# Patient Record
Sex: Male | Born: 1980 | Race: Black or African American | Hispanic: No | Marital: Single | Smoking: Current every day smoker
Health system: Southern US, Community
[De-identification: ages and names within clinical notes are randomized; demographics above are authoritative.]

---

## 2002-02-01 ENCOUNTER — Emergency Department (HOSPITAL_COMMUNITY): Admission: EM | Admit: 2002-02-01 | Discharge: 2002-02-01 | Payer: Self-pay | Admitting: Emergency Medicine

## 2018-03-21 ENCOUNTER — Emergency Department (HOSPITAL_COMMUNITY)
Admission: EM | Admit: 2018-03-21 | Discharge: 2018-03-21 | Disposition: A | Discharge status: Home / Self Care | Payer: Self-pay | Attending: Emergency Medicine | Admitting: Emergency Medicine

## 2018-03-21 ENCOUNTER — Other Ambulatory Visit: Payer: Self-pay

## 2018-03-21 ENCOUNTER — Encounter (HOSPITAL_COMMUNITY): Payer: Self-pay

## 2018-03-21 DIAGNOSIS — S51812A Laceration without foreign body of left forearm, initial encounter: Secondary | ICD-10-CM | POA: Insufficient documentation

## 2018-03-21 DIAGNOSIS — S61512A Laceration without foreign body of left wrist, initial encounter: Secondary | ICD-10-CM | POA: Insufficient documentation

## 2018-03-21 DIAGNOSIS — S01312A Laceration without foreign body of left ear, initial encounter: Secondary | ICD-10-CM | POA: Insufficient documentation

## 2018-03-21 DIAGNOSIS — Y9289 Other specified places as the place of occurrence of the external cause: Secondary | ICD-10-CM | POA: Insufficient documentation

## 2018-03-21 DIAGNOSIS — Y939 Activity, unspecified: Secondary | ICD-10-CM | POA: Insufficient documentation

## 2018-03-21 DIAGNOSIS — S41112A Laceration without foreign body of left upper arm, initial encounter: Secondary | ICD-10-CM | POA: Insufficient documentation

## 2018-03-21 DIAGNOSIS — Y998 Other external cause status: Secondary | ICD-10-CM | POA: Insufficient documentation

## 2018-03-21 DIAGNOSIS — S71112A Laceration without foreign body, left thigh, initial encounter: Secondary | ICD-10-CM | POA: Insufficient documentation

## 2018-03-21 DIAGNOSIS — T07XXXA Unspecified multiple injuries, initial encounter: Secondary | ICD-10-CM

## 2018-03-21 DIAGNOSIS — Z23 Encounter for immunization: Secondary | ICD-10-CM | POA: Insufficient documentation

## 2018-03-21 MED ORDER — LIDOCAINE HCL 2 % IJ SOLN
20.0000 mL | Freq: Once | INTRAMUSCULAR | Status: AC
  Administered 2018-03-21: 400 mg via INTRADERMAL
  Filled 2018-03-21: qty 20

## 2018-03-21 MED ORDER — CEPHALEXIN 500 MG PO CAPS: 500.0000 mg | ORAL_CAPSULE | Freq: Four times a day (QID) | ORAL | 0 refills | Status: AC

## 2018-03-21 MED ORDER — TETANUS-DIPHTH-ACELL PERTUSSIS 5-2.5-18.5 LF-MCG/0.5 IM SUSP
0.5000 mL | Freq: Once | INTRAMUSCULAR | Status: AC
  Administered 2018-03-21: 0.5 mL via INTRAMUSCULAR
  Filled 2018-03-21: qty 0.5
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
## 2018-03-21 NOTE — Discharge Instructions (Addendum)
You were given a prescription for antibiotics. Please take the antibiotic prescription fully.   Please follow-up for suture removal at either urgent care ,the emergency department, or your primary care doctor in 7-10 days.  Please return to the emergency room immediately if you experience any new or worsening symptoms or any symptoms that indicate worsening infection such as fevers, increased redness/swelling/pain, warmth, or drainage from the affected area.   

## 2018-03-21 NOTE — ED Notes (Signed)
EDP at bedside  

## 2018-03-21 NOTE — ED Notes (Signed)
Pt also has a laceration to the left ear and superficial lacerations to the posterior left thigh

## 2018-03-21 NOTE — ED Notes (Signed)
Patient verbalized understanding of discharge instructions and denies any further needs or questions at this time. VS stable. Patient ambulatory with steady gait.  

## 2018-04-04 ENCOUNTER — Ambulatory Visit (HOSPITAL_COMMUNITY)
Admission: EM | Admit: 2018-04-04 | Discharge: 2018-04-04 | Disposition: A | Discharge status: Home / Self Care | Payer: Self-pay

## 2018-04-04 ENCOUNTER — Other Ambulatory Visit: Payer: Self-pay

## 2018-04-04 NOTE — ED Notes (Signed)
Pt here for suture removal. 21 removed by brenda. Pt no further questions

## 2018-11-24 ENCOUNTER — Other Ambulatory Visit: Payer: Self-pay

## 2018-11-24 ENCOUNTER — Encounter (HOSPITAL_COMMUNITY): Payer: Self-pay | Admitting: Emergency Medicine

## 2018-11-24 ENCOUNTER — Emergency Department (HOSPITAL_COMMUNITY)
Admission: EM | Admit: 2018-11-24 | Discharge: 2018-11-24 | Disposition: A | Discharge status: Home / Self Care | Payer: Self-pay | Attending: Emergency Medicine | Admitting: Emergency Medicine

## 2018-11-24 DIAGNOSIS — Y93G1 Activity, food preparation and clean up: Secondary | ICD-10-CM | POA: Insufficient documentation

## 2018-11-24 DIAGNOSIS — W260XXA Contact with knife, initial encounter: Secondary | ICD-10-CM | POA: Insufficient documentation

## 2018-11-24 DIAGNOSIS — Y929 Unspecified place or not applicable: Secondary | ICD-10-CM | POA: Insufficient documentation

## 2018-11-24 DIAGNOSIS — F17200 Nicotine dependence, unspecified, uncomplicated: Secondary | ICD-10-CM | POA: Insufficient documentation

## 2018-11-24 DIAGNOSIS — Y999 Unspecified external cause status: Secondary | ICD-10-CM | POA: Insufficient documentation

## 2018-11-24 DIAGNOSIS — S51811A Laceration without foreign body of right forearm, initial encounter: Secondary | ICD-10-CM | POA: Insufficient documentation

## 2018-11-24 DIAGNOSIS — S41111A Laceration without foreign body of right upper arm, initial encounter: Secondary | ICD-10-CM

## 2018-11-24 MED ORDER — LIDOCAINE HCL (PF) 1 % IJ SOLN
5.0000 mL | Freq: Once | INTRAMUSCULAR | Status: AC
  Administered 2018-11-24: 5 mL
  Filled 2018-11-24: qty 5
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
## 2018-11-24 NOTE — Discharge Instructions (Addendum)
1. Medications: Tylenol or ibuprofen for pain, usual home medications ° °2. Treatment: ice for swelling, keep wound clean with warm soap and water and keep bandage dry, do not submerge in water for 24 hours ° °3. Follow Up: Please return in 10 days to have your stitches/staples removed or sooner if you have concerns. Return to the emergency department for increased redness, drainage of pus from the wound ° ° °WOUND CARE ° Keep area clean and dry for 24 hours. Do not remove bandage, if applied. ° After 24 hours, remove bandage and wash wound gently with mild soap and warm water. Reapply a new bandage after cleaning wound, if directed.  ° Continue daily cleansing with soap and water until stitches/staples are removed. ° Do not apply any ointments or creams to the wound while stitches/staples are in place, as this may cause delayed healing. °Return if you experience any of the following signs of infection: Swelling, redness, pus drainage, streaking, fever >101.0 F ° Return if you experience excessive bleeding that does not stop after 15-20 minutes of constant, firm pressure. ° °

## 2018-11-24 NOTE — ED Triage Notes (Signed)
Pt states he puts his dishes in the floor when cleaning the counter and that he slipped and cut R forearm on a knife that was sticking up 1 hour ago.  Approx 2 cm laceration/puncture wound to R forearm.  Denies LOC.  Denies any other injuries from fall.  Bleeding controlled PTA.

## 2018-11-24 NOTE — ED Notes (Signed)
Wound irrigated, suture cart with lac tray at bedside.

## 2018-12-05 ENCOUNTER — Other Ambulatory Visit: Payer: Self-pay

## 2018-12-05 ENCOUNTER — Emergency Department (HOSPITAL_COMMUNITY)
Admission: EM | Admit: 2018-12-05 | Discharge: 2018-12-05 | Disposition: A | Discharge status: Home / Self Care | Payer: Self-pay | Attending: Emergency Medicine | Admitting: Emergency Medicine

## 2018-12-05 DIAGNOSIS — S51811D Laceration without foreign body of right forearm, subsequent encounter: Secondary | ICD-10-CM | POA: Insufficient documentation

## 2018-12-05 DIAGNOSIS — F17200 Nicotine dependence, unspecified, uncomplicated: Secondary | ICD-10-CM | POA: Insufficient documentation

## 2018-12-05 DIAGNOSIS — X58XXXD Exposure to other specified factors, subsequent encounter: Secondary | ICD-10-CM | POA: Insufficient documentation

## 2018-12-05 DIAGNOSIS — Z5189 Encounter for other specified aftercare: Secondary | ICD-10-CM

## 2018-12-05 DIAGNOSIS — Z4802 Encounter for removal of sutures: Secondary | ICD-10-CM | POA: Insufficient documentation

## 2018-12-05 NOTE — ED Triage Notes (Addendum)
Pt reports numbness and tingling distal to puncture wound for last 10 days. Needs sutures removed and requested an X-ray.

## 2018-12-05 NOTE — ED Provider Notes (Signed)
[facility] EMERGENCY DEPARTMENT Provider Note   CSN: [id number] Arrival date & time: 12/05/18  1132     History   Chief Complaint Chief Complaint  Patient presents with  . Wound Check    HPI Joseph Meadows is a 38 y.o. male presenting to the emergency department for wound recheck and suture removal.  Patient sustained a laceration to the right forearm on 11/24/2018 was sutured with 3 Prolene sutures.  Patient was noted to be neurovascularly intact with normal range of motion on exam.  Reports normal wound healing.  He states he has been experiencing some tingling sensation distal to the wound with certain positions.  He states he also has some decreased sensation distal to the wound.  He has no weakness.     The history is provided by the patient and medical records.    No past medical history on file.  There are no active problems to display for this patient.   No past surgical history on file.      Home Medications    Prior to Admission medications   Not on File    Family History No family history on file.  Social History Social History   Tobacco Use  . Smoking status: Current Every Day Smoker  . Smokeless tobacco: Never Used  Substance Use Topics  . Alcohol use: Yes  . Drug use: Yes    Types: Marijuana     Allergies   Patient has no known allergies.   Review of Systems Review of Systems  All other systems reviewed and are negative.    Physical Exam Updated Vital Signs BP (!) 124/48 (BP Location: Left Arm)   Pulse 84   Resp 16   SpO2 100%   Physical Exam Vitals signs and nursing note reviewed.  Constitutional:      General: He is not in acute distress.    Appearance: He is well-developed.  HENT:     Head: Normocephalic and atraumatic.  Eyes:     Conjunctiva/sclera: Conjunctivae normal.  Cardiovascular:     Rate and Rhythm: Normal rate.  Pulmonary:     Effort: Pulmonary effort is normal.  Musculoskeletal:   Comments: Small sutured wound to mid right lateral forearm with 3 sutures in place.  Wound appears to be healing well.  Wound is clean.  No swelling or drainage.  Patient reports some mild decrease sensation just distal to the wound on the right arm extending in a straight line from the wound down through the thumb.  Normal sensation otherwise.  Strong and equal grip strength bilaterally.  Normal radial pulse.  Normal range of motion of the wrist and forearm.  Neurological:     Mental Status: He is alert.  Psychiatric:        Mood and Affect: Mood normal.        Behavior: Behavior normal.      ED Treatments / Results  Labs (all labs ordered are listed, but only abnormal results are displayed) Labs Reviewed - No data to display  EKG None  Radiology No results found.  Procedures .Suture Removal  Date/Time: 12/05/2018 11:49 AM Performed by: Robinson, [place] N, PA-C Authorized by: Robinson, [place] N, PA-C   Consent:    Consent obtained:  Verbal   Consent given by:  Patient   Risks discussed:  Pain and wound separation Location:    Location:  Upper extremity   Upper extremity location:  Arm   Arm location:  R lower  arm Procedure details:    Wound appearance:  No signs of infection, good wound healing and clean   Number of sutures removed:  3 Post-procedure details:    Patient tolerance of procedure:  Tolerated well, no immediate complications   (including critical care time)  Medications Ordered in ED Medications - No data to display   Initial Impression / Assessment and Plan / ED Course  I have reviewed the triage vital signs and the nursing notes.  Pertinent labs & imaging results that were available during my care of the patient were reviewed by me and considered in my medical decision making (see chart for details).      Pt to ER for staple/suture removal and wound check as above. Procedure tolerated well. Vitals normal, no signs of infection.  Patient reports  some decreased sensation distal to the wound, tracking in a straight line from the wound down to the thumb.  He has no weakness on exam and normal pulses.  Discussed possibility of side effects after lidocaine versus localized inflammation as cause of symptoms however will provide hand specialist referral for follow-up if symptoms persist.  Scar minimization & return precautions given at dc.  Patient agreeable to plan and safe for discharge.   Final Clinical Impressions(s) / ED Diagnoses   Final diagnoses:  Visit for wound check  Visit for suture removal    ED Discharge Orders    None       Robinson, [place] N, PA-C 12/05/18 1159    Virgina Norfolk, DO 12/05/18 1200

## 2019-03-26 ENCOUNTER — Emergency Department (HOSPITAL_COMMUNITY)
Admission: EM | Admit: 2019-03-26 | Discharge: 2019-03-26 | Disposition: A | Discharge status: Home / Self Care | Payer: Self-pay | Attending: Emergency Medicine | Admitting: Emergency Medicine

## 2019-03-26 ENCOUNTER — Other Ambulatory Visit: Payer: Self-pay

## 2019-03-26 ENCOUNTER — Encounter (HOSPITAL_COMMUNITY): Payer: Self-pay | Admitting: Emergency Medicine

## 2019-03-26 DIAGNOSIS — F17210 Nicotine dependence, cigarettes, uncomplicated: Secondary | ICD-10-CM | POA: Insufficient documentation

## 2019-03-26 DIAGNOSIS — M545 Low back pain, unspecified: Secondary | ICD-10-CM

## 2019-03-26 DIAGNOSIS — Y9367 Activity, basketball: Secondary | ICD-10-CM | POA: Insufficient documentation

## 2019-03-26 MED ORDER — NAPROXEN 500 MG PO TABS: 500.0000 mg | ORAL_TABLET | Freq: Two times a day (BID) | ORAL | 0 refills | Status: AC

## 2019-03-26 MED ORDER — METHOCARBAMOL 500 MG PO TABS: 500.0000 mg | ORAL_TABLET | Freq: Two times a day (BID) | ORAL | 0 refills | Status: AC

## 2019-03-26 MED ORDER — METHOCARBAMOL 500 MG PO TABS
500.0000 mg | ORAL_TABLET | Freq: Once | ORAL | Status: AC
  Administered 2019-03-26: 500 mg via ORAL
  Filled 2019-03-26: qty 1

## 2019-03-26 MED ORDER — KETOROLAC TROMETHAMINE 60 MG/2ML IM SOLN
60.0000 mg | Freq: Once | INTRAMUSCULAR | Status: AC
  Administered 2019-03-26: 05:00:00 60 mg via INTRAMUSCULAR
  Filled 2019-03-26: qty 2

## 2019-03-26 NOTE — ED Triage Notes (Signed)
Pt was playing basketball and as he was shooting the ball "my back when out and I fell to the ground."  Pain is in the lower left side of his back.   Pt states he is unable to stand up straight and has trouble "moving my left leg."

## 2019-03-26 NOTE — Discharge Instructions (Signed)
Take the prescribed medication as directed.  May wish to use heat/ice on the back to help with pain. Follow-up with your primary care doctor. Return to the ED for new or worsening symptoms.

## 2019-03-26 NOTE — ED Provider Notes (Signed)
[facility] EMERGENCY DEPARTMENT Provider Note   CSN: [id number] Arrival date & time: 03/26/19  8657     History Chief Complaint  Patient presents with  . Back Pain    Joseph Meadows is a 39 y.o. male.  The history is provided by the patient and medical records.  Back Pain   39 y.o. male presenting to the ED with back pain.  Patient reports he was playing basketball and went to do a crossover against opponent, went up for a lay up and felt his back "lock up".  States he fell to the floor.  States left side of his lower back feels tight and stiff, worse with standing and trying to move his left leg.  He denies any numbness/weakness of the legs.  No bowel or bladder incontinence.  He did try taking some Motrin at home without relief. States "I think I need something to help relax".    History reviewed. No pertinent past medical history.  There are no problems to display for this patient.   History reviewed. No pertinent surgical history.     No family history on file.  Social History   Tobacco Use  . Smoking status: Current Every Day Smoker  . Smokeless tobacco: Never Used  Substance Use Topics  . Alcohol use: Yes  . Drug use: Yes    Types: Marijuana    Home Medications Prior to Admission medications   Not on File    Allergies    Patient has no known allergies.  Review of Systems   Review of Systems  Musculoskeletal: Positive for back pain.  All other systems reviewed and are negative.   Physical Exam Updated Vital Signs BP 127/63 (BP Location: Right Arm)   Pulse 70   Temp 98.3 F (36.8 C) (Oral)   Resp 18   Ht 6' (1.829 m)   Wt 95.3 kg   SpO2 100%   BMI 28.48 kg/m   Physical Exam Vitals and nursing note reviewed.  Constitutional:      Appearance: He is well-developed.  HENT:     Head: Normocephalic and atraumatic.  Eyes:     Conjunctiva/sclera: Conjunctivae normal.     Pupils: Pupils are equal, round, and reactive to light.   Cardiovascular:     Rate and Rhythm: Normal rate and regular rhythm.     Heart sounds: Normal heart sounds.  Pulmonary:     Effort: Pulmonary effort is normal. No respiratory distress.     Breath sounds: Normal breath sounds. No rhonchi.  Abdominal:     General: Bowel sounds are normal.     Palpations: Abdomen is soft.     Tenderness: There is no guarding.     Hernia: No hernia is present.  Musculoskeletal:        General: Normal range of motion.     Cervical back: Normal range of motion.     Comments: Endorses pain of left lumbar area without any focal tenderness, step-off, or deformity; some pain with movement of the left leg but not a distinctly positive straight leg raise, he has normal strength and sensation of both legs  Skin:    General: Skin is warm and dry.  Neurological:     Mental Status: He is alert and oriented to person, place, and time.     ED Results / Procedures / Treatments   Labs (all labs ordered are listed, but only abnormal results are displayed) Labs Reviewed - No data to display  EKG None  Radiology No results found.  Procedures Procedures (including critical care time)  Medications Ordered in ED Medications  ketorolac (TORADOL) injection 60 mg (60 mg Intramuscular Given 03/26/19 0507)  methocarbamol (ROBAXIN) tablet 500 mg (500 mg Oral Given 03/26/19 0507)    ED Course  I have reviewed the triage vital signs and the nursing notes.  Pertinent labs & imaging results that were available during my care of the patient were reviewed by me and considered in my medical decision making (see chart for details).    MDM Rules/Calculators/A&P  39 year old male presenting to the ED with left lower back pain.  States while playing basketball his back "locked up".  Has had ongoing pain since this.  Tried Motrin at home without relief.  He is afebrile and nontoxic in appearance.  On exam he is lying flat on his stomach.  He reports pain of his left lumbar  paraspinal area without any noted step-off or deformity.  He is able to lift both of his legs up off the bed independently, does endorse pain with movement of left leg.  This is not a distinctly positive straight leg raise.  No apparent strength or sensory deficit to the legs.  No bowel or bladder incontinence.  Clinically this is not concerning for cauda equina or other emergent pathology.  Feel he likely tweaked his back while playing basketball as he does report going in for a crossover prior to this happening.  He was given Toradol and Robaxin here with some improvement and is moving more easily.  Feel he is stable for discharge home with continued symptomatic care.  Also encouraged heat and ice.  Follow-up with PCP.  Return here for any new or acute changes.  Final Clinical Impression(s) / ED Diagnoses Final diagnoses:  Acute left-sided low back pain without sciatica    Rx / DC Orders ED Discharge Orders         Ordered    methocarbamol (ROBAXIN) 500 MG tablet  2 times daily     03/26/19 0546    naproxen (NAPROSYN) 500 MG tablet  2 times daily with meals     03/26/19 0546           Garlon Hatchet, PA-C 03/26/19 0604    Dione Booze, MD 03/26/19 [phone]

## 2019-10-12 ENCOUNTER — Other Ambulatory Visit: Payer: Self-pay

## 2019-10-12 ENCOUNTER — Emergency Department (HOSPITAL_COMMUNITY)
Admission: EM | Admit: 2019-10-12 | Discharge: 2019-10-13 | Disposition: A | Discharge status: Home / Self Care | Payer: Self-pay | Attending: Emergency Medicine | Admitting: Emergency Medicine

## 2019-10-12 ENCOUNTER — Encounter (HOSPITAL_COMMUNITY): Payer: Self-pay

## 2019-10-12 ENCOUNTER — Emergency Department (HOSPITAL_COMMUNITY)
Admission: EM | Admit: 2019-10-12 | Discharge: 2019-10-12 | Disposition: A | Discharge status: Home / Self Care | Payer: Self-pay | Attending: Emergency Medicine | Admitting: Emergency Medicine

## 2019-10-12 DIAGNOSIS — Y92007 Garden or yard of unspecified non-institutional (private) residence as the place of occurrence of the external cause: Secondary | ICD-10-CM | POA: Insufficient documentation

## 2019-10-12 DIAGNOSIS — Z5321 Procedure and treatment not carried out due to patient leaving prior to being seen by health care provider: Secondary | ICD-10-CM | POA: Insufficient documentation

## 2019-10-12 DIAGNOSIS — Y9361 Activity, american tackle football: Secondary | ICD-10-CM | POA: Insufficient documentation

## 2019-10-12 DIAGNOSIS — F17210 Nicotine dependence, cigarettes, uncomplicated: Secondary | ICD-10-CM | POA: Insufficient documentation

## 2019-10-12 DIAGNOSIS — X58XXXA Exposure to other specified factors, initial encounter: Secondary | ICD-10-CM | POA: Insufficient documentation

## 2019-10-12 DIAGNOSIS — S81811A Laceration without foreign body, right lower leg, initial encounter: Secondary | ICD-10-CM | POA: Insufficient documentation

## 2019-10-12 DIAGNOSIS — W25XXXA Contact with sharp glass, initial encounter: Secondary | ICD-10-CM | POA: Insufficient documentation

## 2019-10-12 DIAGNOSIS — S81819A Laceration without foreign body, unspecified lower leg, initial encounter: Secondary | ICD-10-CM | POA: Insufficient documentation

## 2019-10-12 MED ORDER — LIDOCAINE-EPINEPHRINE 2 %-1:100000 IJ SOLN
INTRAMUSCULAR | Status: AC
  Filled 2019-10-12: qty 1

## 2019-10-12 NOTE — ED Triage Notes (Addendum)
Arrived POV from home. Patient reports he fell on som glass injuring right lower leg. Patient has 2 lacerations on right lower leg. Bleeding controlled. Right leg is swollen

## 2019-10-12 NOTE — ED Triage Notes (Signed)
Pt playing football outside, fell and something in grass cut pt leg on posterior leg. Lateral lac 7cm long.  Horizontal lac 3.3cm x 1 cm.  Bleeding controlled.  Lacs cleaned and 4x4 placed over wounds in triage.

## 2019-10-13 MED ORDER — CARBAMIDE PEROXIDE 6.5 % OT SOLN: 5.0000 [drp] | Freq: Two times a day (BID) | OTIC | 0 refills | Status: DC

## 2019-10-13 MED ORDER — CARBAMIDE PEROXIDE 6.5 % OT SOLN: 5.0000 [drp] | Freq: Two times a day (BID) | OTIC | 0 refills | Status: AC

## 2019-10-13 NOTE — ED Provider Notes (Signed)
[facility]-EMERGENCY DEPT Provider Note   CSN: [id number] Arrival date & time: 10/12/19  2234     History Chief Complaint  Patient presents with  . Laceration    Joseph Meadows is a 39 y.o. male.  Patient presents to the emergency department with a chief complaint of lacerations.  He states that he was playing football with his friends earlier today and was tackled on the grass.  Unfortunately, there was broken glass where he was tackled.  He sustained 2 lacerations to his right lower extremity.  He denies any treatments prior to arrival.  Bleeding is controlled prior to arrival.  Last tetanus shot was within 5 years.  Denies numbness, weakness, or tingling.  Denies difficulty with ambulating.  The history is provided by the patient. No language interpreter was used.       History reviewed. No pertinent past medical history.  There are no problems to display for this patient.   No past surgical history on file.     No family history on file.  Social History   Tobacco Use  . Smoking status: Current Every Day Smoker    Packs/day: 1.00    Types: Cigarettes  . Smokeless tobacco: Never Used  Substance Use Topics  . Alcohol use: Yes  . Drug use: Yes    Types: Marijuana    Home Medications Prior to Admission medications   Medication Sig Start Date End Date Taking? Authorizing Provider  methocarbamol (ROBAXIN) 500 MG tablet Take 1 tablet (500 mg total) by mouth 2 (two) times daily. 03/26/19   Garlon Hatchet, PA-C  naproxen (NAPROSYN) 500 MG tablet Take 1 tablet (500 mg total) by mouth 2 (two) times daily with a meal. 03/26/19   Garlon Hatchet, PA-C    Allergies    Patient has no known allergies.  Review of Systems   Review of Systems  All other systems reviewed and are negative.   Physical Exam Updated Vital Signs There were no vitals taken for this visit.  Physical Exam Vitals and nursing note reviewed.  Constitutional:      Appearance:  He is well-developed.  HENT:     Head: Normocephalic and atraumatic.  Eyes:     Conjunctiva/sclera: Conjunctivae normal.  Cardiovascular:     Rate and Rhythm: Normal rate and regular rhythm.     Heart sounds: No murmur heard.   Pulmonary:     Effort: Pulmonary effort is normal. No respiratory distress.     Breath sounds: Normal breath sounds.  Abdominal:     Palpations: Abdomen is soft.     Tenderness: There is no abdominal tenderness.  Musculoskeletal:        General: Normal range of motion.     Cervical back: Neck supple.  Skin:    General: Skin is warm and dry.     Comments: 3 cm laceration to the right posterior calf, 7 cm laceration to right lateral leg.  No foreign bodies, no evidence of tendon or muscle or fascia injury  Neurological:     Mental Status: He is alert and oriented to person, place, and time.  Psychiatric:        Mood and Affect: Mood normal.        Behavior: Behavior normal.     ED Results / Procedures / Treatments   Labs (all labs ordered are listed, but only abnormal results are displayed) Labs Reviewed - No data to display  EKG None  Radiology No results  found.  Procedures .Marland KitchenLaceration Repair  Date/Time: 10/13/2019 12:41 AM Performed by: Roxy Horseman, PA-C Authorized by: Roxy Horseman, PA-C   Consent:    Consent obtained:  Verbal   Consent given by:  Patient   Risks discussed:  Infection, need for additional repair, pain, poor cosmetic result and poor wound healing   Alternatives discussed:  No treatment and delayed treatment Universal protocol:    Procedure explained and questions answered to patient or proxy's satisfaction: yes     Relevant documents present and verified: yes     Test results available and properly labeled: yes     Imaging studies available: yes     Required blood products, implants, devices, and special equipment available: yes     Site/side marked: yes     Immediately prior to procedure, a time out was  called: yes     Patient identity confirmed:  Verbally with patient Anesthesia (see MAR for exact dosages):    Anesthesia method:  Local infiltration Laceration details:    Location:  Leg   Leg location:  R lower leg   Length (cm):  3 Repair type:    Repair type:  Simple Pre-procedure details:    Preparation:  Patient was prepped and draped in usual sterile fashion Exploration:    Hemostasis achieved with:  Direct pressure   Wound exploration: wound explored through full range of motion and entire depth of wound probed and visualized     Wound extent: no fascia violation noted, no foreign bodies/material noted, no muscle damage noted, no tendon damage noted and no vascular damage noted     Contaminated: no   Treatment:    Area cleansed with:  Saline   Amount of cleaning:  Standard   Irrigation solution:  Sterile saline   Irrigation method:  Syringe   Visualized foreign bodies/material removed: no   Skin repair:    Repair method:  Sutures   Suture size:  4-0   Suture technique:  Running locked   Number of sutures:  6 Approximation:    Approximation:  Close Post-procedure details:    Dressing:  Adhesive bandage   Patient tolerance of procedure:  Tolerated well, no immediate complications .Marland KitchenLaceration Repair  Date/Time: 10/13/2019 12:42 AM Performed by: Roxy Horseman, PA-C Authorized by: Roxy Horseman, PA-C   Consent:    Consent obtained:  Verbal   Consent given by:  Patient   Risks discussed:  Infection, need for additional repair, pain, poor cosmetic result and poor wound healing   Alternatives discussed:  No treatment and delayed treatment Universal protocol:    Procedure explained and questions answered to patient or proxy's satisfaction: yes     Relevant documents present and verified: yes     Test results available and properly labeled: yes     Imaging studies available: yes     Required blood products, implants, devices, and special equipment available: yes       Site/side marked: yes     Immediately prior to procedure, a time out was called: yes     Patient identity confirmed:  Verbally with patient Anesthesia (see MAR for exact dosages):    Anesthesia method:  Local infiltration Laceration details:    Location:  Leg   Leg location:  R lower leg   Length (cm):  7 Repair type:    Repair type:  Simple Pre-procedure details:    Preparation:  Patient was prepped and draped in usual sterile fashion Exploration:    Hemostasis achieved with:  Direct pressure   Wound exploration: wound explored through full range of motion and entire depth of wound probed and visualized     Wound extent: no fascia violation noted, no foreign bodies/material noted, no muscle damage noted, no tendon damage noted and no vascular damage noted     Contaminated: no   Treatment:    Area cleansed with:  Saline   Amount of cleaning:  Standard   Irrigation solution:  Sterile saline   Irrigation method:  Syringe   Visualized foreign bodies/material removed: no   Skin repair:    Repair method:  Sutures   Suture size:  4-0   Suture technique:  Running locked   Number of sutures:  9 Approximation:    Approximation:  Close Post-procedure details:    Dressing:  Adhesive bandage   Patient tolerance of procedure:  Tolerated well, no immediate complications   (including critical care time)  Medications Ordered in ED Medications  lidocaine-EPINEPHrine (XYLOCAINE W/EPI) 2 %-1:100000 (with pres) injection (has no administration in time range)    ED Course  I have reviewed the triage vital signs and the nursing notes.  Pertinent labs & imaging results that were available during my care of the patient were reviewed by me and considered in my medical decision making (see chart for details).    MDM Rules/Calculators/A&P                          Patient here with 2 lacerations to the right lower extremity.  Lacerations were cleaned and repaired by me.  Tetanus shot is  current.  No complicating features.  Sutures out in 14 days. Final Clinical Impression(s) / ED Diagnoses Final diagnoses:  Laceration of right lower extremity, initial encounter    Rx / DC Orders ED Discharge Orders    None       Roxy Horseman, PA-C 10/13/19 0043    Shon Baton, MD 10/13/19 [phone]

## 2019-11-13 ENCOUNTER — Emergency Department (HOSPITAL_COMMUNITY): Payer: Self-pay

## 2019-11-13 ENCOUNTER — Emergency Department (HOSPITAL_COMMUNITY)
Admission: EM | Admit: 2019-11-13 | Discharge: 2019-11-13 | Disposition: A | Discharge status: Home / Self Care | Payer: Self-pay | Attending: Emergency Medicine | Admitting: Emergency Medicine

## 2019-11-13 DIAGNOSIS — X501XXA Overexertion from prolonged static or awkward postures, initial encounter: Secondary | ICD-10-CM | POA: Insufficient documentation

## 2019-11-13 DIAGNOSIS — M25561 Pain in right knee: Secondary | ICD-10-CM | POA: Insufficient documentation

## 2019-11-13 DIAGNOSIS — S93401A Sprain of unspecified ligament of right ankle, initial encounter: Secondary | ICD-10-CM | POA: Insufficient documentation

## 2019-11-13 DIAGNOSIS — Y9367 Activity, basketball: Secondary | ICD-10-CM | POA: Insufficient documentation

## 2019-11-13 DIAGNOSIS — F17210 Nicotine dependence, cigarettes, uncomplicated: Secondary | ICD-10-CM | POA: Insufficient documentation

## 2019-11-13 NOTE — Progress Notes (Signed)
Orthopedic Tech Progress Note Patient Details:  Joseph Meadows 06-05-1980 [id number]  Ortho Devices Type of Ortho Device: Ankle Air splint Ortho Device/Splint Location: right Ortho Device/Splint Interventions: Application   Post Interventions Patient Tolerated: Well Instructions Provided: Care of device   Saul Fordyce 11/13/2019, 11:25 AM

## 2019-11-13 NOTE — ED Notes (Signed)
An After Visit Summary was printed and given to the patient. Discharge instructions given and no further questions at this time.  

## 2019-11-13 NOTE — ED Provider Notes (Signed)
[facility]-EMERGENCY DEPT Provider Note   CSN: [id number] Arrival date & time: 11/13/19  1012     History Chief Complaint  Patient presents with  . Ankle Pain  . Knee Pain    Kadarrius Yanke is a 39 y.o. male.  HPI Patient is a 39 year old male with no pertinent medical history who presents to the emergency department with right ankle and knee pain.  Patient states he was playing basketball and felt like his "right knee gave out" and then inverted his right ankle resulting in pain in both regions.  He felt that his pain was worse this morning so he presented to the emergency department for evaluation.  Patient reports worsening pain with ambulation and palpation of the regions.  Denies any numbness, tingling, weakness.    No past medical history on file.  There are no problems to display for this patient.   No past surgical history on file.     No family history on file.  Social History   Tobacco Use  . Smoking status: Current Every Day Smoker    Packs/day: 1.00    Types: Cigarettes  . Smokeless tobacco: Never Used  Substance Use Topics  . Alcohol use: Yes  . Drug use: Yes    Types: Marijuana    Home Medications Prior to Admission medications   Medication Sig Start Date End Date Taking? Authorizing Provider  methocarbamol (ROBAXIN) 500 MG tablet Take 1 tablet (500 mg total) by mouth 2 (two) times daily. 03/26/19   Garlon Hatchet, PA-C  naproxen (NAPROSYN) 500 MG tablet Take 1 tablet (500 mg total) by mouth 2 (two) times daily with a meal. 03/26/19   Garlon Hatchet, PA-C    Allergies    Patient has no known allergies.  Review of Systems   Review of Systems  Musculoskeletal: Positive for arthralgias, joint swelling and myalgias.  Skin: Negative for color change and wound.  Neurological: Negative for weakness and numbness.   Physical Exam Updated Vital Signs BP 139/84 (BP Location: Right Arm)   Pulse 69   Temp 98 F (36.7 C) (Oral)    Resp 17   Ht 6' (1.829 m)   Wt 95.3 kg   SpO2 100%   BMI 28.48 kg/m   Physical Exam Vitals and nursing note reviewed.  Constitutional:      General: He is not in acute distress.    Appearance: He is well-developed.  HENT:     Head: Normocephalic and atraumatic.     Right Ear: External ear normal.     Left Ear: External ear normal.  Eyes:     General: No scleral icterus.       Right eye: No discharge.        Left eye: No discharge.     Conjunctiva/sclera: Conjunctivae normal.  Neck:     Trachea: No tracheal deviation.  Cardiovascular:     Rate and Rhythm: Normal rate.  Pulmonary:     Effort: Pulmonary effort is normal. No respiratory distress.     Breath sounds: No stridor.  Abdominal:     General: There is no distension.  Musculoskeletal:        General: Swelling and tenderness present. No deformity.     Cervical back: Neck supple.     Comments: Right ankle: Small point of edema and moderate tenderness noted just superior and dorsal to the lateral malleolus.  No significant ecchymosis or erythema.  Difficult to assess range of motion of  the ankle secondary to pain.  Patient able to plantar flex and dorsiflex against resistance.  Right knee: Full range of motion of the right knee.  Very mild TTP noted along the medial and lateral joint lines.  No anterior or posterior tenderness.  Negative anterior/posterior drawer.  No visible effusion.  No ecchymosis, increased warmth, erythema.  Patient is able to ambulate with an antalgic gait.  Distal sensation intact.  Palpable pedal pulses.  Skin:    General: Skin is warm and dry.     Findings: No rash.  Neurological:     Mental Status: He is alert.     Cranial Nerves: Cranial nerve deficit: no gross deficits.    ED Results / Procedures / Treatments   Labs (all labs ordered are listed, but only abnormal results are displayed) Labs Reviewed - No data to display  EKG None  Radiology DG Ankle Complete Right  Result Date:  11/13/2019 CLINICAL DATA:  Posttraumatic ankle pain EXAM: RIGHT ANKLE - COMPLETE 3+ VIEW COMPARISON:  None. FINDINGS: There is no evidence of fracture, dislocation, or joint effusion. The Achilles enthesophyte IMPRESSION: Negative. Electronically Signed   By: Marnee Spring M.D.   On: 11/13/2019 11:00   DG Knee Complete 4 Views Right  Result Date: 11/13/2019 CLINICAL DATA:  Twisting injury playing basketball yesterday. EXAM: RIGHT KNEE - COMPLETE 4+ VIEW COMPARISON:  None. FINDINGS: No acute fracture or dislocation. No definite knee joint effusion. Mild lateral and patellofemoral compartment joint space narrowing with subchondral sclerosis. IMPRESSION: No acute osseous abnormality. Electronically Signed   By: Jeronimo Greaves M.D.   On: 11/13/2019 10:59   Procedures Procedures (including critical care time)  Medications Ordered in ED Medications - No data to display  ED Course  I have reviewed the triage vital signs and the nursing notes.  Pertinent labs & imaging results that were available during my care of the patient were reviewed by me and considered in my medical decision making (see chart for details).    MDM Rules/Calculators/A&P                          Pt is a 39 y.o. male that presents with a history, physical exam, and ED Clinical Course as noted above.   Patient presents with right ankle and knee pain that occurred while playing basketball.  X-rays were obtained of both regions showing no acute osseous abnormalities.  Physical exam is generally reassuring.  There is some mild tenderness just superior to the lateral malleolus of the right ankle as well as the medial and lateral joint lines of the right knee.  Patient is able to ambulate with an antalgic gait.  He is neurovascularly intact in the right lower extremity.  Patient is able to dorsiflex and plantar flex his right foot against resistance.    Patient was given a right ankle brace.  Recommended use of ibuprofen and  Tylenol for management of his pain.  We discussed dosing.  We discussed the RICE method.  Patient does not have a PCP or health insurance so he was given a referral to [facility].  He is planning on following up with them.  His questions were answered and he was amicable at the time of discharge.  His vital signs are stable.   An After Visit Summary was printed and given to the patient.  Patient discharged to home/self care.  Condition at discharge: Stable  Note: Portions  of this report may have been transcribed using voice recognition software. Every effort was made to ensure accuracy; however, inadvertent computerized transcription errors may be present.   Final Clinical Impression(s) / ED Diagnoses Final diagnoses:  Acute pain of right knee  Sprain of right ankle, unspecified ligament, initial encounter   Rx / DC Orders ED Discharge Orders    None       Placido Sou, PA-C 11/13/19 1132    Tegeler, Canary Brim, MD 11/13/19 1635

## 2019-11-13 NOTE — Discharge Instructions (Signed)
I recommend a combination of tylenol and ibuprofen for management of your pain. You can take a low dose of both at the same time. I recommend 325 mg of Tylenol combined with 400 mg of ibuprofen. This is one regular Tylenol and two regular ibuprofen. You can take these 2-3 times for day for your pain. Please try to take these medications with a small amount of food as well to prevent upsetting your stomach.  Also, please consider topical pain relieving creams such as Voltaran Gel, BioFreeze, or Icy Hot. There is also a pain relieving cream made by [employer]. You should be able to find all of these at your local pharmacy.   Please adhere to the "RICE Method" for rehabbing. This stands for "rest, ice, compress, and elevate". Please continue to perform stretches and exercise as much as your pain allows. It is important that you keep moving to help heal your injury.   Feel free to return to the emergency department with any new or worsening symptoms.  It was a pleasure to meet you.

## 2019-11-13 NOTE — ED Triage Notes (Signed)
Pt arrived via walk in, c/o right ankle and knee pain. States he was playing basketball, landed on his right ankle, twisted it. Worsening pain this morning.

## 2021-08-05 ENCOUNTER — Emergency Department (HOSPITAL_COMMUNITY)
Admission: EM | Admit: 2021-08-05 | Discharge: 2021-08-06 | Disposition: A | Discharge status: Home / Self Care | Payer: Managed Care, Other (non HMO) | Attending: Emergency Medicine | Admitting: Emergency Medicine

## 2021-08-05 ENCOUNTER — Other Ambulatory Visit: Payer: Self-pay

## 2021-08-05 DIAGNOSIS — W3400XA Accidental discharge from unspecified firearms or gun, initial encounter: Secondary | ICD-10-CM | POA: Insufficient documentation

## 2021-08-05 DIAGNOSIS — S60552A Superficial foreign body of left hand, initial encounter: Secondary | ICD-10-CM | POA: Insufficient documentation
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
## 2021-08-06 ENCOUNTER — Emergency Department (HOSPITAL_COMMUNITY): Payer: Managed Care, Other (non HMO)

## 2021-08-06 MED ORDER — CEPHALEXIN 500 MG PO CAPS: 500.0000 mg | ORAL_CAPSULE | Freq: Four times a day (QID) | ORAL | 0 refills | Status: AC

## 2021-08-06 NOTE — Discharge Instructions (Signed)
You have a foreign body in your left hand, have started you on antibiotics  take as prescribed.  Recommend over-the-counter pain medication.  Please follow-up with hand surgery for further evaluation  Come back to the emergency department if you develop chest pain, shortness of breath, severe abdominal pain, uncontrolled nausea, vomiting, diarrhea.
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
## 2022-04-06 IMAGING — CR DG KNEE COMPLETE 4+V*R*
4 series · 4 of 4 positions shown · non-contrast
Comparison: None.

CLINICAL DATA: Twisting injury playing basketball yesterday.

EXAM:
RIGHT KNEE - COMPLETE 4+ VIEW

[t knee ap right]
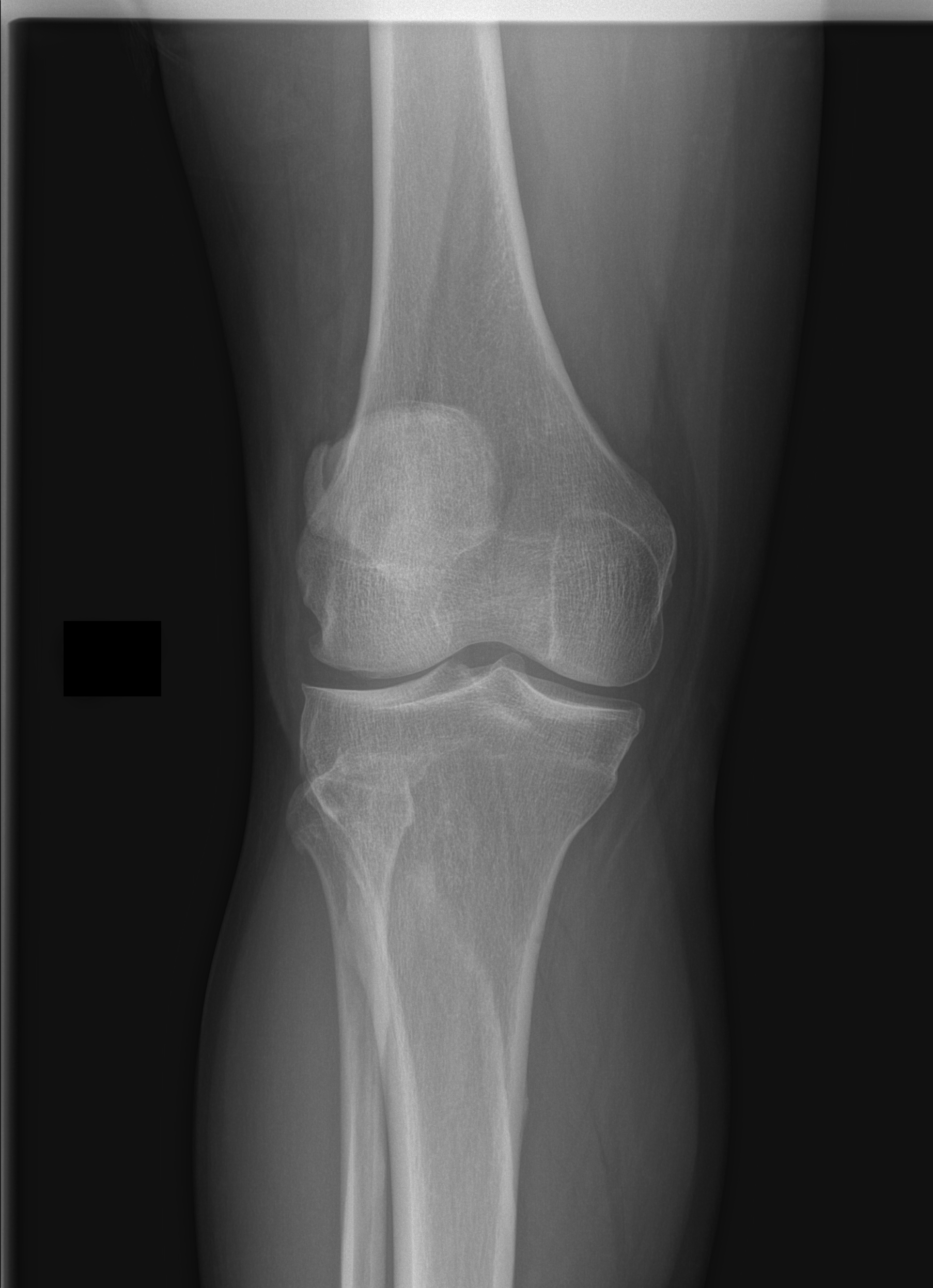

[t knee obl right (1 of 2)]
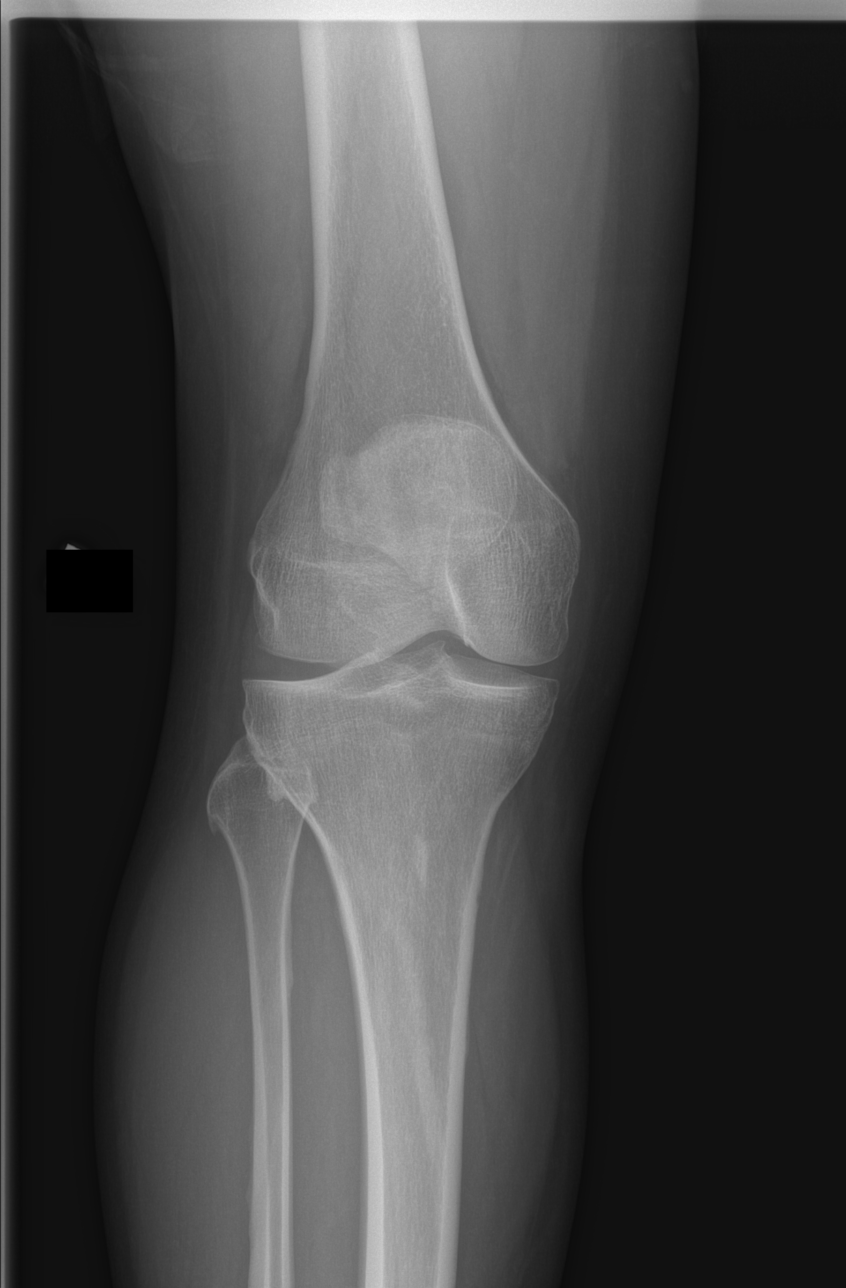

[t knee obl right (2 of 2)]
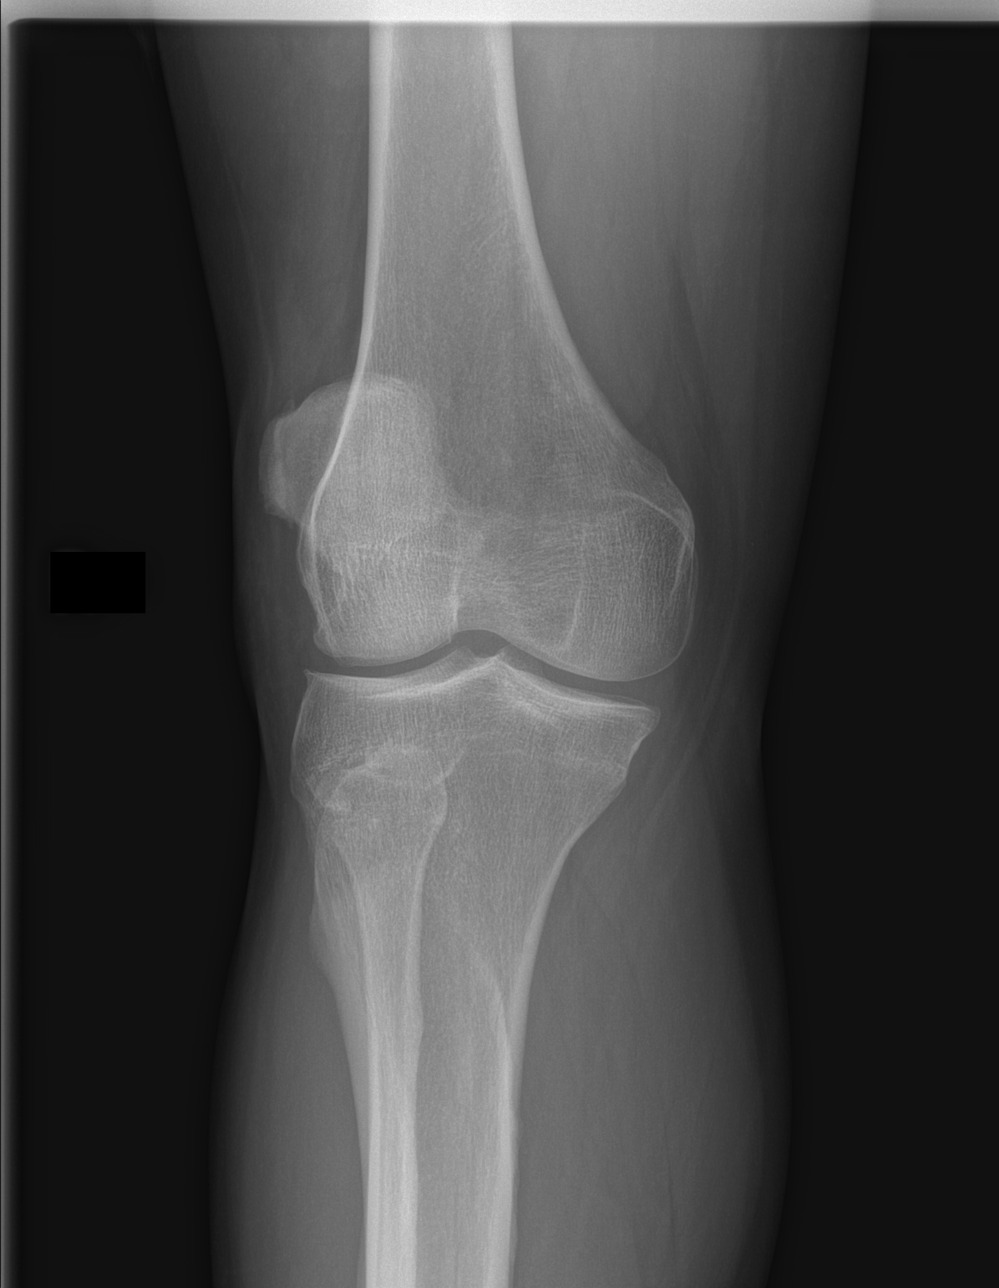

[t knee lat right]
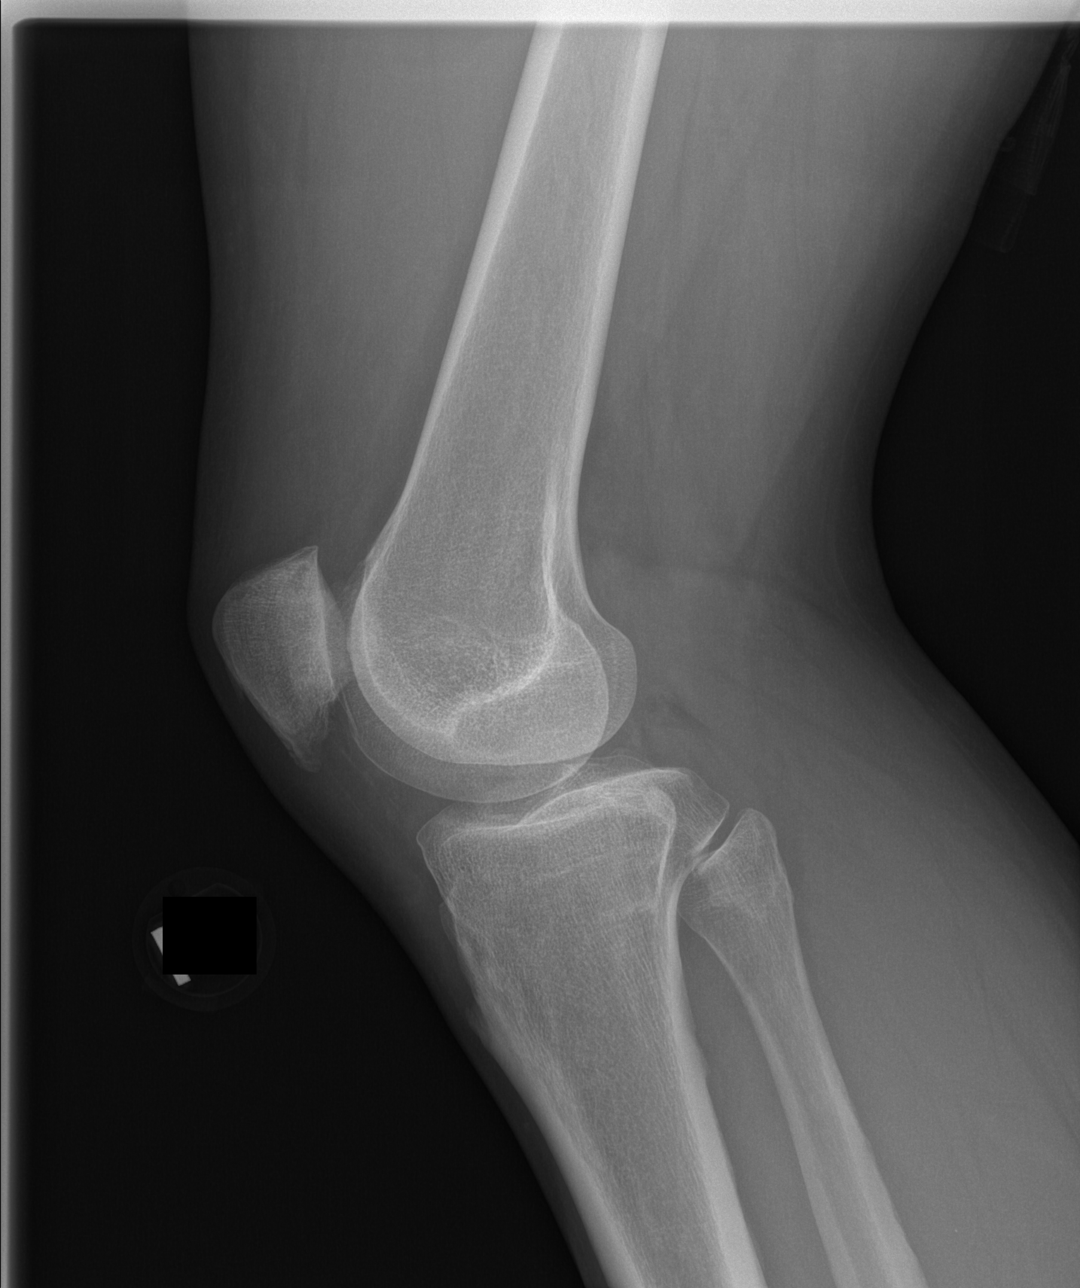

[4 of 4 positions shown; findings below may reference images not displayed]

FINDINGS: No acute fracture or dislocation. No definite knee joint effusion.
Mild lateral and patellofemoral compartment joint space narrowing
with subchondral sclerosis.
IMPRESSION: No acute osseous abnormality.

## 2022-06-05 ENCOUNTER — Emergency Department (HOSPITAL_COMMUNITY)
Admission: EM | Admit: 2022-06-05 | Discharge: 2022-06-05 | Disposition: A | Discharge status: Home / Self Care | Payer: Medicaid Other | Attending: Emergency Medicine | Admitting: Emergency Medicine

## 2022-06-05 ENCOUNTER — Other Ambulatory Visit: Payer: Self-pay

## 2022-06-05 ENCOUNTER — Emergency Department (HOSPITAL_COMMUNITY): Payer: Medicaid Other

## 2022-06-05 ENCOUNTER — Encounter (HOSPITAL_COMMUNITY): Payer: Self-pay

## 2022-06-05 DIAGNOSIS — R001 Bradycardia, unspecified: Secondary | ICD-10-CM | POA: Insufficient documentation

## 2022-06-05 DIAGNOSIS — R0789 Other chest pain: Secondary | ICD-10-CM | POA: Insufficient documentation

## 2022-06-05 LAB — BASIC METABOLIC PANEL
Anion gap: 10 (ref 5–15)
BUN: 12 mg/dL (ref 6–20)
CO2: 26 mmol/L (ref 22–32)
Calcium: 8.4 mg/dL — ABNORMAL LOW (ref 8.9–10.3)
Chloride: 101 mmol/L (ref 98–111)
Creatinine, Ser: 1.14 mg/dL (ref 0.61–1.24)
GFR, Estimated: >60 mL/min (ref >60)
Glucose, Bld: 107 mg/dL — ABNORMAL HIGH (ref 70–99)
Potassium: 4 mmol/L (ref 3.5–5.1)
Sodium: 137 mmol/L (ref 135–145)

## 2022-06-05 LAB — CBC
HCT: 44.6 % (ref 39.0–52.0)
Hemoglobin: 14 g/dL (ref 13.0–17.0)
MCH: 25.7 pg — ABNORMAL LOW (ref 26.0–34.0)
MCHC: 31.4 g/dL (ref 30.0–36.0)
MCV: 82 fL (ref 80.0–100.0)
Platelets: 134 10*3/uL — ABNORMAL LOW (ref 150–400)
RBC: 5.44 MIL/uL (ref 4.22–5.81)
RDW: 14.6 % (ref 11.5–15.5)
WBC: 7.5 10*3/uL (ref 4.0–10.5)
nRBC: 0 % (ref 0.0–0.2)

## 2022-06-05 LAB — TROPONIN I (HIGH SENSITIVITY)
Troponin I (High Sensitivity): 6 ng/L (ref <18)
Troponin I (High Sensitivity): 6 ng/L (ref <18)

## 2022-06-05 MED ORDER — CYCLOBENZAPRINE HCL 10 MG PO TABS
10.0000 mg | ORAL_TABLET | Freq: Once | ORAL | Status: AC
  Administered 2022-06-05: 10 mg via ORAL
  Filled 2022-06-05: qty 1

## 2022-06-05 MED ORDER — KETOROLAC TROMETHAMINE 15 MG/ML IJ SOLN
15.0000 mg | Freq: Once | INTRAMUSCULAR | Status: AC
  Administered 2022-06-05: 15 mg via INTRAVENOUS
  Filled 2022-06-05: qty 1

## 2022-06-05 MED ORDER — CYCLOBENZAPRINE HCL 10 MG PO TABS: 10.0000 mg | ORAL_TABLET | Freq: Three times a day (TID) | ORAL | 0 refills | Status: AC | PRN

## 2022-06-05 MED ORDER — LIDOCAINE 5 % EX PTCH: 1.0000 | MEDICATED_PATCH | CUTANEOUS | 0 refills | Status: AC

## 2022-06-05 MED ORDER — LIDOCAINE 5 % EX PTCH
1.0000 | MEDICATED_PATCH | CUTANEOUS | Status: DC
  Administered 2022-06-05: 1 via TRANSDERMAL
  Filled 2022-06-05: qty 1

## 2022-06-05 MED ORDER — IBUPROFEN 600 MG PO TABS: 600.0000 mg | ORAL_TABLET | Freq: Three times a day (TID) | ORAL | 0 refills | Status: AC | PRN

## 2022-06-05 NOTE — ED Triage Notes (Signed)
Pt to er room number 25 via ems, per ems pt is here for some chest pain.  States that pt woke this am with chest pain, states that it is worse with deep breathing and movement.  States that the started and iv, gave 324 mg of aspirin, 1 sl ngt, and about of ns.  Pt states that the pain is worse when he moves his head.

## 2022-06-05 NOTE — ED Provider Notes (Signed)
[place] [facility]PARTMENT AT [facility] Provider Note   CSN: [id number] Arrival date & time: 06/05/22  0848     History  Chief Complaint  Patient presents with   Chest Pain    Joseph Meadows is a 42 y.o. male with no past medical history presents to the ED complaining of chest pain that started when he woke up this morning.  He states that pain is to the left side of his chest and is worse with any movement and deep breathing.  He was transported to the ED by EMS and given 324 mg aspirin and 1 sublingual nitroglycerin.  He states that at onset pain was a 9/10 and currently it is a 5/10.  He does work [employer] but has not had any unusual activity lately.  No known injury.  He denies associated fever, chills, cough, congestion, shortness of breath, abdominal pain, nausea, vomiting, or diarrhea.  He denies lower extremity edema or pain.  He denies recent travel, surgery, or history of DVT/PE.  Family history is unknown.  No history of the symptoms.  He smokes approximately 0.5 to 0.75 packs/day of tobacco as well as marijuana.  Occasional alcohol use.  No known cardiac history.      Home Medications No daily medications  Allergies    Patient has no known allergies.    Review of Systems   Review of Systems  All other systems reviewed and are negative.   Physical Exam Updated Vital Signs BP 114/69   Pulse 80   Temp 97.9 F (36.6 C) (Oral)   Resp 15   Ht 6' (1.829 m)   Wt 93 kg   SpO2 100%   BMI 27.80 kg/m  Physical Exam Vitals and nursing note reviewed.  Constitutional:      General: He is not in acute distress.    Appearance: Normal appearance. He is not ill-appearing, toxic-appearing or diaphoretic.  HENT:     Head: Normocephalic and atraumatic.     Mouth/Throat:     Mouth: Mucous membranes are moist.  Eyes:     Extraocular Movements: Extraocular movements intact.     Conjunctiva/sclera: Conjunctivae normal.     Pupils: Pupils are equal, round,  and reactive to light.  Neck:     Vascular: No JVD.  Cardiovascular:     Rate and Rhythm: Regular rhythm. Bradycardia present.     Heart sounds: Normal heart sounds. No murmur heard.    No S3 or S4 sounds.  Pulmonary:     Effort: Pulmonary effort is normal. No tachypnea, accessory muscle usage or respiratory distress.     Breath sounds: Normal breath sounds. No stridor. No decreased breath sounds, wheezing, rhonchi or rales.  Chest:     Chest wall: Tenderness (L chest wall, reproduces complaint) present. No mass, deformity, crepitus or edema.  Abdominal:     General: Abdomen is flat.     Palpations: Abdomen is soft. There is no mass.     Tenderness: There is no abdominal tenderness. There is no guarding or rebound.  Musculoskeletal:        General: Normal range of motion.     Cervical back: Neck supple.     Right lower leg: No tenderness. No edema.     Left lower leg: No tenderness. No edema.  Skin:    General: Skin is warm and dry.     Capillary Refill: Capillary refill takes less than 2 seconds.     Findings: No rash.  Neurological:     General: No focal deficit present.     Mental Status: He is alert and oriented to person, place, and time. Mental status is at baseline.  Psychiatric:        Mood and Affect: Mood normal.        Behavior: Behavior normal.     ED Results / Procedures / Treatments   Labs (all labs ordered are listed, but only abnormal results are displayed) Labs Reviewed  BASIC METABOLIC PANEL - Abnormal; Notable for the following components:      Result Value   Glucose, Bld 107 (*)    Calcium 8.4 (*)    All other components within normal limits  CBC - Abnormal; Notable for the following components:   MCH 25.7 (*)    Platelets 134 (*)    All other components within normal limits  TROPONIN I (HIGH SENSITIVITY)  TROPONIN I (HIGH SENSITIVITY)    EKG EKG Interpretation  Date/Time:  Sunday Jun 05 2022 08:57:54 EDT Ventricular Rate:  47 PR  Interval:  157 QRS Duration: 89 QT Interval:  417 QTC Calculation: 369 R Axis:   75 Text Interpretation: Sinus bradycardia no acute ST/T changes Confirmed by Pricilla Loveless [phone]) on 06/05/2022 9:33:28 AM  Radiology DG Chest 2 View  Result Date: 06/05/2022 CLINICAL DATA:  42 year old male with chest pain, pleuritic pain, dizziness. EXAM: CHEST - 2 VIEW COMPARISON:  None Available. FINDINGS: PA and lateral views at 0920 hours. Normal lung volumes and mediastinal contours. Visualized tracheal air column is within normal limits. Both lungs appear clear. No pneumothorax or pleural effusion. No acute osseous abnormality identified. Negative visible bowel gas. IMPRESSION: Negative.  No cardiopulmonary abnormality. Electronically Signed   By: Odessa Fleming M.D.   On: 06/05/2022 09:30    Procedures Procedures    Medications Ordered in ED Medications  lidocaine (LIDODERM) 5 % 1 patch (1 patch Transdermal Patch Applied 06/05/22 0934)  cyclobenzaprine (FLEXERIL) tablet 10 mg (10 mg Oral Given 06/05/22 0933)  ketorolac (TORADOL) 15 MG/ML injection 15 mg (15 mg Intravenous Given 06/05/22 0935)    ED Course/ Medical Decision Making/ A&P                             Medical Decision Making Amount and/or Complexity of Data Reviewed Labs: ordered. Decision-making details documented in ED Course. Radiology: ordered. Decision-making details documented in ED Course. ECG/medicine tests: ordered. Decision-making details documented in ED Course.  Risk Prescription drug management.   Medical Decision Making:   Joseph Meadows is a 42 y.o. male who presented to the ED today with chest pain detailed above.    Patient placed on continuous vitals and telemetry monitoring while in ED which was reviewed periodically.  Complete initial physical exam performed, notably the patient  was in no acute distress.  Mild bradycardia with regular rhythm.  No murmur, rub, or gallop.  No S3 or S4.  Lungs clear to auscultation and  no signs of respiratory distress.  Tenderness to palpation of the left chest wall which reproduces complaint.  Abdomen soft nontender.  No lower extremity edema or tenderness.  Neurologically intact. Reviewed and confirmed nursing documentation for past medical history, family history, social history.    Initial Assessment:   With the patient's presentation of chest pain, the emergent differential diagnosis of chest pain includes: Acute coronary syndrome, pericarditis, aortic dissection, pulmonary embolism, tension pneumothorax, and esophageal rupture.  I do not believe  the patient has an emergent cause of chest pain, other urgent/non-acute considerations include, but are not limited to: chronic angina, aortic stenosis, cardiomyopathy, myocarditis, mitral valve prolapse, pulmonary hypertension, hypertrophic obstructive cardiomyopathy (HOCM), aortic insufficiency, right ventricular hypertrophy, pneumonia, pleuritis, bronchitis, pneumothorax, tumor, gastroesophageal reflux disease (GERD), esophageal spasm, Mallory-Weiss syndrome, peptic ulcer disease, biliary disease, pancreatitis, functional gastrointestinal pain, cervical or thoracic disk disease or arthritis, shoulder arthritis, costochondritis, subacromial bursitis, anxiety or panic attack, herpes zoster, breast disorders, chest wall tumors, thoracic outlet syndrome, mediastinitis.    Initial Plan:  Screening labs including CBC and Metabolic panel to evaluate for infectious or metabolic etiology of disease.  Urinalysis with reflex culture ordered to evaluate for UTI or relevant urologic/nephrologic pathology.  CXR to evaluate for structural/infectious intrathoracic pathology.  EKG and troponin to evaluate for cardiac pathology Objective evaluation as reviewed   Initial Study Results:   Laboratory  All laboratory results reviewed without evidence of clinically relevant pathology.    EKG EKG was reviewed independently. ST segments without  concerns for elevations.   EKG: sinus bradycardia.   Radiology:  All images reviewed independently. Agree with radiology report at this time.   DG Chest 2 View  Result Date: 06/05/2022 CLINICAL DATA:  42 year old male with chest pain, pleuritic pain, dizziness. EXAM: CHEST - 2 VIEW COMPARISON:  None Available. FINDINGS: PA and lateral views at 0920 hours. Normal lung volumes and mediastinal contours. Visualized tracheal air column is within normal limits. Both lungs appear clear. No pneumothorax or pleural effusion. No acute osseous abnormality identified. Negative visible bowel gas. IMPRESSION: Negative.  No cardiopulmonary abnormality. Electronically Signed   By: Odessa Fleming M.D.   On: 06/05/2022 09:30      Final Assessment and Plan:   42 year old male presents to the ED for evaluation of chest pain.  Chest pain reproduced with movement and breathing.  No history of cardiac issues.  No known injury to the chest.  On exam, patient has a mild bradycardia with regular rhythm.  No JVD.  No lower extremity edema or tenderness.  He does have tenderness to the left chest wall which reproduces complaint.  Lungs clear to auscultation.  Chest x-ray negative.  EKG sinus bradycardia without acute ST-T changes.  Patient appears comfortable in no acute distress.  Troponin negative x 2.  No other significant findings on lab work today.  100% oxygen saturation on room air without signs of respiratory distress.  Low suspicion for respiratory process, PE.  Overall, with reassuring workup and with patient's reproducible pain suspect that this is likely musculoskeletal in nature.  Will treat symptomatically at home and have patient closely follow-up with primary care as needed.  Strict ED return precautions given, all questions answered, and stable for discharge.   Clinical Impression:  1. Chest wall pain      Discharge           Final Clinical Impression(s) / ED Diagnoses Final diagnoses:  Chest wall pain     Rx / DC Orders ED Discharge Orders          Ordered    cyclobenzaprine (FLEXERIL) 10 MG tablet  3 times daily PRN        06/05/22 1226    ibuprofen (ADVIL) 600 MG tablet  Every 8 hours PRN        06/05/22 1226    lidocaine (LIDODERM) 5 %  Every 24 hours        06/05/22 1226  Richardson Dopp 06/05/22 1332    Pricilla Loveless, MD 06/05/22 [phone]

## 2022-06-05 NOTE — ED Notes (Signed)
Pt in bed, pt denies pain, pt states that he has pain with movement, pt has no requests, sig other at bedside.

## 2022-06-05 NOTE — Discharge Instructions (Addendum)
Thank you for letting us take care of you today.   Overall, your workup was reassuring including your chest x-ray, EKG, cardiac enzymes, and blood work.  I do believe that your symptoms are related to musculoskeletal pain.  For this, I am prescribing muscle relaxers, NSAIDs, and topical medication for home.  Please use these over the next few days to help with your symptoms.  You may also use a heating pad and over-the-counter Tylenol as needed.  I do believe that you would benefit from establishing primary care.  I provided 2 clinics that you may contact to schedule this follow-up or you may go to a PCP of your own choosing.  They can monitor any chronic conditions and do any appropriate testing or referrals to specialist for any continued symptoms.  For any new or worsening condition, please return to the nearest ED for reevaluation.

## 2022-07-07 ENCOUNTER — Emergency Department (HOSPITAL_COMMUNITY)
Admission: EM | Admit: 2022-07-07 | Discharge: 2022-07-07 | Disposition: A | Discharge status: Home / Self Care | Payer: Medicaid Other | Attending: Emergency Medicine | Admitting: Emergency Medicine

## 2022-07-07 ENCOUNTER — Encounter (HOSPITAL_COMMUNITY): Payer: Self-pay

## 2022-07-07 DIAGNOSIS — S60552D Superficial foreign body of left hand, subsequent encounter: Secondary | ICD-10-CM | POA: Diagnosis not present

## 2022-07-07 DIAGNOSIS — W34010D Accidental discharge of airgun, subsequent encounter: Secondary | ICD-10-CM | POA: Insufficient documentation

## 2022-07-07 DIAGNOSIS — Z202 Contact with and (suspected) exposure to infections with a predominantly sexual mode of transmission: Secondary | ICD-10-CM | POA: Diagnosis not present

## 2022-07-07 MED ORDER — METRONIDAZOLE 500 MG PO TABS
2000.0000 mg | ORAL_TABLET | Freq: Once | ORAL | Status: AC
  Administered 2022-07-07: 2000 mg via ORAL
  Filled 2022-07-07: qty 4
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
